# Patient Record
Sex: Male | Born: 1950 | Race: White | Hispanic: No | Marital: Married | Smoking: Never smoker
Health system: Southern US, Community
[De-identification: ages and names within clinical notes are randomized; demographics above are authoritative.]

## PROBLEM LIST (undated history)

## (undated) DIAGNOSIS — E119 Type 2 diabetes mellitus without complications: Secondary | ICD-10-CM

## (undated) HISTORY — PX: FOOT SURGERY: SHX648

## (undated) HISTORY — PX: PROSTATE SURGERY: SHX751

## (undated) HISTORY — PX: OTHER SURGICAL HISTORY: SHX169

---

## 2005-04-26 ENCOUNTER — Ambulatory Visit: Payer: Self-pay | Admitting: Family Medicine

## 2005-06-23 ENCOUNTER — Ambulatory Visit: Payer: Self-pay | Admitting: Family Medicine

## 2005-07-08 ENCOUNTER — Ambulatory Visit: Payer: Self-pay | Admitting: Family Medicine

## 2009-08-20 ENCOUNTER — Ambulatory Visit: Payer: Self-pay | Admitting: Unknown Physician Specialty

## 2009-08-27 ENCOUNTER — Ambulatory Visit: Payer: Self-pay | Admitting: Unknown Physician Specialty

## 2011-06-04 ENCOUNTER — Ambulatory Visit: Payer: Self-pay | Admitting: Unknown Physician Specialty

## 2011-09-18 ENCOUNTER — Emergency Department: Payer: Self-pay | Admitting: Emergency Medicine

## 2011-09-26 ENCOUNTER — Inpatient Hospital Stay: Payer: Self-pay | Admitting: Internal Medicine

## 2011-09-27 ENCOUNTER — Ambulatory Visit: Payer: Self-pay | Admitting: Urology

## 2011-09-28 LAB — BASIC METABOLIC PANEL
Anion Gap: 8 (ref 7–16)
BUN: 15 mg/dL (ref 7–18)
Calcium, Total: 8.6 mg/dL (ref 8.5–10.1)
EGFR (Non-African Amer.): >60
Glucose: 107 mg/dL — ABNORMAL HIGH (ref 65–99)
Potassium: 4 mmol/L (ref 3.5–5.1)
Sodium: 143 mmol/L (ref 136–145)

## 2011-09-29 LAB — BASIC METABOLIC PANEL
Chloride: 101 mmol/L (ref 98–107)
Co2: 27 mmol/L (ref 21–32)
Creatinine: 0.99 mg/dL (ref 0.60–1.30)
EGFR (African American): >60
EGFR (Non-African Amer.): >60
Glucose: 142 mg/dL — ABNORMAL HIGH (ref 65–99)
Osmolality: 277 (ref 275–301)

## 2011-10-18 ENCOUNTER — Ambulatory Visit: Payer: Self-pay | Admitting: Urology

## 2013-04-07 ENCOUNTER — Emergency Department: Payer: Self-pay | Admitting: Emergency Medicine

## 2013-04-07 LAB — COMPREHENSIVE METABOLIC PANEL
BUN: 14 mg/dL (ref 7–18)
Bilirubin,Total: 0.8 mg/dL (ref 0.2–1.0)
Calcium, Total: 8.6 mg/dL (ref 8.5–10.1)
Co2: 24 mmol/L (ref 21–32)
Creatinine: 1.1 mg/dL (ref 0.60–1.30)
EGFR (Non-African Amer.): >60
Glucose: 230 mg/dL — ABNORMAL HIGH (ref 65–99)
Osmolality: 274 (ref 275–301)
SGPT (ALT): 22 U/L (ref 12–78)
Sodium: 133 mmol/L — ABNORMAL LOW (ref 136–145)
Total Protein: 7.8 g/dL (ref 6.4–8.2)

## 2013-04-07 LAB — URINALYSIS, COMPLETE
Glucose,UR: >=500 mg/dL (ref 0–75)
Squamous Epithelial: <1
WBC UR: 15 /HPF (ref 0–5)

## 2013-04-07 LAB — CBC
HCT: 37.5 % — ABNORMAL LOW (ref 40.0–52.0)
HGB: 12.9 g/dL — ABNORMAL LOW (ref 13.0–18.0)
MCV: 91 fL (ref 80–100)
RBC: 4.13 10*6/uL — ABNORMAL LOW (ref 4.40–5.90)
RDW: 13.3 % (ref 11.5–14.5)
WBC: 12.8 10*3/uL — ABNORMAL HIGH (ref 3.8–10.6)

## 2013-04-07 LAB — LIPASE, BLOOD: Lipase: 167 U/L (ref 73–393)

## 2013-04-08 LAB — URINE CULTURE

## 2013-09-26 ENCOUNTER — Ambulatory Visit: Payer: Self-pay | Admitting: Family Medicine

## 2014-02-15 ENCOUNTER — Ambulatory Visit: Payer: Self-pay | Admitting: Unknown Physician Specialty

## 2015-01-19 NOTE — Consult Note (Signed)
PATIENT NAME:  Thomas Oconnell, Thomas Oconnell MR#:  [id number] DATE OF BIRTH:  02/12/1951  DATE OF CONSULTATION:  09/27/2011  REFERRING PHYSICIAN:  Katha Hamming, MD  CONSULTING PHYSICIAN:  Marin Olp, MD PRIMARY CARE PHYSICIAN: Dorothey Baseman, MD    REASON FOR CONSULTATION:  1. Obstructive uropathy with acute renal insufficiency.  2. Gross hematuria.  3. PSA elevation.  4. Urinary tract infection.   HISTORY OF PRESENT ILLNESS: The patient is a 64 year old white male with a history of insulin-dependent type 2 diabetes who was admitted via the Emergency Room on 09/26/2011 with acute renal insufficiency, hyperglycemia. The patient was in his usual state of health until 09/18/2011 when he developed the acute onset of fevers with a temperature to greater than 102 degrees Fahrenheit and shaking chills with an inability to void. The patient presented to the [facility] Emergency Department and was found to have an elevated white blood cell count at 16.8 thousand, a urinalysis consistent with a urinary tract infection. Blood sugar was 220 with a creatinine of 0.94. Blood cultures and urine cultures were sent. A noncontrasted CT of the abdomen and pelvis revealed the question of stones within the right posterior aspect of the bladder but was otherwise within normal limits without evidence for hydronephrosis or renal or ureterolithiasis. The patient had a catheterized urine which was sent for urinalysis. There is no documentation in the medical record as to the volume of urine obtained. The patient does not recall, either, how much urine was obtained with catheterization. The patient denies any discomfort with the catheterization and also reports that the digital rectal examination of the prostate performed in the Emergency Department was not painful or uncomfortable. The patient was discharged from the Emergency Department on Flomax and ciprofloxacin (10-day course). The patient also reports that he  had been experiencing lower abdominal pain and right lower back pain, throbbing in nature, reaching a 6 or 7 out of 10. The patient reports voiding a couple of times in the Emergency Department without difficulty prior to discharge with complete resolution of his suprapubic and right lower back pain. The urine culture later grew out greater than 100,000 colonies of a pansensitive Escherichia coli. The blood cultures were negative. The patient did well initially but approximately 48 hours later developed extreme frequency and urgency with urge incontinence and incontinence without awareness, particularly at night, requiring frequent clothing changes. The patient did not experience any dysuria or gross hematuria or recurrent fevers or chills. The patient presented to his primary care doctor's office on December 27th and was seen by a physician's assistant who prescribed Vicodin for pain as needed and scheduled the patient for Urologic consultation with Dr. Anola Gurney on Thursday, January 3rd. The patient continued, however, with frequency and urgency and extreme urinary incontinence and developed recurrent suprapubic and right lower back pain that would be somewhat relieved after voiding but was rapidly recurrent. This prompted the patient's representation to the [facility] Emergency Department on 09/26/2011.  Evaluation in the Emergency Department revealed acute renal insufficiency with a creatinine of 3.58, hyperglycemia with a blood sugar of 516, PSA elevation with a level of 7.9, and a urinalysis that was significant for a glucosuria but was otherwise negative without hematuria or pyuria or bacteriuria. A stone CT of the abdomen and pelvis without intravenous or p.o. contrast revealed mild bilateral hydronephrosis with severe bladder distention. No renal or ureterolithiasis or stones in the bladder were appreciated. A Foley catheter was placed with  the prompt drainage of 2000 mL per the  patient's report. The patient reported prompt relief of the suprapubic discomfort. With regard to the right lower back pain, the patient reports marked relief with the passage of flatus during an attempted bowel movement this morning. Urology is being consulted for further evaluation of the patient's acute renal failure with urinary retention as well as the development of gross hematuria after placement of the Foley catheter and PSA elevation.   GENITOURINARY HISTORY: The patient denies any prior history of lower urinary tract symptoms or urinary incontinence He would experience some mild increase in urinary frequency when his serum blood sugars were elevated but would quickly resolve with improvement in his blood sugars. The patient denies any personal or family history of urolithiasis or gross hematuria. The patient denies any family history of prostate cancer.   PAST MEDICAL HISTORY:   Adult onset diabetes mellitus present for 10 years and requiring insulin for the past year.  Hypothyroidism for the past five years.  Question of hypertension. The patient reports being placed on ramipril 10 years ago with the onset of his diabetes and was told that this was for renal protection and not necessarily for hypertension.   PAST SURGICAL HISTORY: The patient is status post hammertoe repair approximately seven years ago.   MEDICATIONS ON ADMISSION:  1. Tamsulosin 0.4 mg p.o. daily after the same meal.  2. Amaryl 4 mg p.o. daily.  3. Metformin 1000 mg p.o. at bedtime (this was held upon admission).  4. Ramipril 10 mg p.o. daily.  5. Levemir insulin 60 units subcutaneous in the a.m. and 45 units subcutaneous in the p.m.  6. Synthroid 0.1 mg p.o. daily.  7. Ciprofloxacin 500 mg p.o. b.i.d.  8. Aspirin 81 mg p.o. daily.  9. Multivitamin 1 p.o. daily.  10. B complex vitamin 1 p.o. daily.   ALLERGIES: No known drug allergies.   SOCIAL HISTORY: The patient is married. The patient denies any present or  past tobacco use. The patient denies any alcohol use or history of alcohol dependence.   FAMILY HISTORY: Significant for diabetes and hypertension but negative for urolithiasis or prostate cancer.   REVIEW OF SYSTEMS: GENERAL: As per the history of present illness. HEENT:  The patient reports chronic occasional sinusitis with early morning mild headaches that resolve after a hot shower. The patient also reports some recent epistaxis with drying of the nasal mucosa. RESPIRATORY: The patient denies any shortness of breath or chronic cough. CARDIOVASCULAR: The patient denies any chest pain. GI: The patient denies any nausea or vomiting but does report some loose stools over the past week that resolved with milk of magnesia. The patient denies any blood per rectum. GENITOURINARY: As per the history of present illness. HEMATOLOGIC: The patient denies any bleeding diathesis.  MUSCULOSKELETAL: The patient denies any muscle or joint pain. NEUROLOGIC: The patient denies any focal or lateralizing weakness. PSYCHIATRIC: The patient denies any depression or anxiety or suicidal ideation. SKIN: The patient denies any rashes or lesions.   PHYSICAL EXAMINATION:  VITAL SIGNS: Temperature 98.1 degrees Fahrenheit, pulse 84 and regular, respirations 18 and unlabored, blood pressure 128/75, pulse oximetry 93% on room air.   GENERAL: A well developed, well nourished white male in no apparent distress.   HEENT: Normocephalic and atraumatic with anicteric sclerae. Extraocular movements are intact.   NECK: No masses or bruits.   CHEST: Clear to auscultation with normal respiratory effort.  CARDIOVASCULAR: Regular rate and rhythm without murmurs, gallops, or rubs.  2+ radial pulses bilaterally, 1+ carotid pulses bilaterally.   ABDOMEN: Nontender, nondistended abdomen with normal active bowel sounds with no palpable abdominal masses or organomegaly. No inguinal hernias appreciated. Mild right costovertebral angle tenderness  present.   GENITOURINARY/RECTAL: Normal uncircumcised phallus with Foley catheter in place with mild blood-tinging of the urine in the tubing. Bilaterally descended testes without masses and nontender. Mild tenderness of the right epididymis without edema or induration. Digital rectal examination of the prostate revealed a normal sphincter tone with a 1+ enlarged prostate that was firm bilaterally but without induration, nodularity, or tenderness. The rectal vault was empty and without masses.   EXTREMITIES: Examination revealed trace pitting edema to the knees bilaterally.   SKIN: Skin did not reveal any rashes or lesions about the head, face, neck or upper extremities.   NEUROLOGIC: Nonfocal.   PSYCHIATRIC: Alert and oriented x4.   LABORATORY, DIAGNOSTIC AND RADIOLOGICAL DATA:  Sodium of 142, potassium 4.3, chloride 106, CO2 27, BUN 28, creatinine 1.61, glucose 134, calcium 8.5.  White blood cell count 8.2 thousand, hematocrit 33.9, platelet count 241.  Lower extremity Doppler ultrasound did not reveal any deep venous thrombosis.   ASSESSMENT:  1. Obstructive uropathy with acute renal insufficiency due to urinary retention: I agree with Foley catheterization. Would expect a 1 mg percent improvement in the creatinine per 24 hours of Foley decompression. Indeed, the creatinine has already improved from 3.58 to 1.61 from yesterday to today. The patient's most recent baseline was normal at 0.94 on 09/18/2011. The urine output was 2575 mL as of 7:00 a.m. this morning. Certainly, the patient is at risk for a persistent postobstructive diuresis, but currently he is not demonstrating this clinically and remains hemodynamically stable.   2. Gross hematuria: This developed after a Foley catheter decompression of a markedly distended bladder with 2000 mL obtained upon Foley catheterization in the Emergency Department and is not unexpected. The hematuria appears markedly improved this afternoon. There is no  indication for further evaluation at the moment.  3. PSA elevation: The PSA was elevated at 7.9 in the Emergency Department yesterday and could certainly be due to a number of different factors, including a urinary tract infection and recent Foley catheterization. There is no indication for further evaluation at the moment, but this should definitely be followed up as an outpatient to ensure normalization after resolution of the urinary tract infection and urinary retention.  4. Urinary tract infection: The patient has a culture-confirmed Escherichia coli urinary tract infection from a catheterized sample performed in the Emergency Department on 09/18/2011 that was treated appropriately with ciprofloxacin. The patient denies any prostatic tenderness on examination back on the 22nd. On examination today, he does have some tenderness of the right epididymis consistent with a mild right epididymitis.   RECOMMENDATIONS:  1. Continue Foley catheter decompression of the bladder until maximal improvement in renal function is achieved. The Foley catheter should be left in place for a minimum of one week given the extent of the bladder distention. The patient has an appointment already scheduled with Dr. Anola Gurney this Thursday, January 3rd, and should keep his appointment if he is discharged prior to Thursday.  2. Continue ciprofloxacin with dosing appropriate for the patient's renal function, anticipate being discharged on 500 mg p.o. b.i.d.  3. Continue tamsulosin 0.4 mg p.o. daily after the same meal.   Thank you for the opportunity to participate in the care of this most pleasant gentleman. We will follow while the patient is in  the hospital.   ____________________________ Marin Olp, MD [id number] D: 09/27/2011 13:05:51 ET T: 09/27/2011 14:34:57 ET JOB#: [id number]  cc: Marin Olp, MD, <Dictator> Marin Olp MD ELECTRONICALLY SIGNED 11/12/2011 15:55

## 2015-01-19 NOTE — Consult Note (Signed)
PATIENT NAME:  Thomas Oconnell, Thomas Oconnell MR#:  [id number] DATE OF BIRTH:  11-29-1950  DATE OF CONSULTATION:  09/27/2011  REFERRING PHYSICIAN:  Fulton Reek, MD CONSULTING PHYSICIAN:  Elven Laboy Lilian Kapur, MD  REASON FOR CONSULTATION: Acute renal failure, urinary retention, bilateral mild hydronephrosis.   HISTORY OF PRESENT ILLNESS: The patient is a very pleasant 64 year old Caucasian male with past medical history of benign prostatic hypertrophy, diabetes mellitus type 2, hypothyroidism, hypertension, and left foot surgery who presented to [facility] with complaints of lower extremity swelling originally. He has had a few recent visits to [facility] recently. He was here relatively recently for urinary retention secondary to benign prostatic hypertrophy as well as urinary tract infection. He was prescribed Flomax for treatment as well as antibiotic therapy. An appointment was made with Dr. Yves Dill for outpatient urology evaluation. However, he subsequently developed lower extremity swelling. He was subsequently seen in an acute care clinic locally. He was found to have acute renal failure and was sent for admission. He was also found to have hyperglycemia. The patient had a CT scan of the abdomen and pelvis which demonstrated mild bilateral hydronephrosis. The bladder was severely distended. He reports to me that he has had some troubles with urination relatively recently. It is difficult to maintain a good stream. He also has periods of urinary incontinence. He also has to strain at times to adequately urinate. The patient's creatinine upon admission was found to be 3.58. However, with Foley catheter drainage creatinine is now down to 1.6. He had 2 liters of urine output immediately upon Foley catheter placement. Over the past 24 hours he had 2.5 liters of urine output. Urology has also been consulted this hospitalization.   PAST MEDICAL HISTORY:  1. Diabetes mellitus, type  2.  2. Hypertension.  3. History of left foot surgery.  4. Hypothyroidism.  5. Benign prostatic hypertrophy.   ALLERGIES: No known drug allergies.    HOME MEDICATIONS:  1. Amaryl 4 mg p.o. b.i.d.  2. Metformin 1000 mg p.o. q. p.m.  3. Ramipril 10 mg daily.  4. Levemir 60 units subcutaneous every a.m. and 45 units subcutaneous q. p.m.  5. Synthroid 0.1 mg p.o. daily.  6. Flomax 0.4 mg p.o. daily.   SOCIAL HISTORY: The patient lives close to [place] [address]. He is married. He has six children. He denies tobacco, alcohol, or illicit drug use.   FAMILY HISTORY: Significant for chronic obstructive pulmonary disease, diabetes mellitus, and hypertension.  REVIEW OF SYSTEMS: CONSTITUTIONAL: Patient denies fevers, chills, or weight loss. EYES: Denies diplopia or blurry vision. HENT: Denies headaches, hearing loss, tinnitus, epistaxis, or sore throat. PULMONARY: Denies cough, shortness of breath, or hemoptysis. CARDIOVASCULAR: Denies chest pain, palpitations, PND, or orthopnea. GI: Denies nausea, vomiting, diarrhea, or bloody stools. GU: Reports significant urinary incontinence, dysuria at times, poor urine stream. NEUROLOGIC: Denies focal extremity numbness, weakness, or tingling. HEMATOLOGIC/LYMPHATIC: Denies easy bruisability, bleeding, or swollen lymph nodes. ENDOCRINE: Denies polyuria, polydipsia, or polyphagia. MUSCULOSKELETAL: Denies joint pain, swelling, or redness. INTEGUMENT: Denies skin rashes or lesions.  PSYCHIATRIC: Denies depression or bipolar disorder. ALLERGY/IMMUNOLOGIC: Denies seasonal allergies or history of immunodeficiency.   PHYSICAL EXAMINATION:  VITAL SIGNS: Temperature 98.1, pulse 84, respirations 18, blood pressure 128/75, pulse oximetry 93%.  GENERAL:  Well-developed, well-nourished Caucasian male who appears his stated age, currently in no acute distress.   HEENT: Normocephalic, atraumatic. Extraocular movements intact. Pupils equal, round, and reactive  to light. No scleral icterus. Conjunctivae  are pink. No epistaxis noted. Gross hearing intact. Oral mucosa moist. The patient has dentures in place.   NECK: Supple. No JVD or lymphadenopathy.   LUNGS: Clear to auscultation bilaterally with normal respiratory effort.   HEART: S1, S2. Regular rate and rhythm. No murmurs, rubs, or gallops appreciated.   ABDOMEN: Soft, nontender, nondistended. Bowel sounds positive. No rebound or guarding. No gross organomegaly appreciated.   EXTREMITIES: 2+ bilateral lower extremity edema noted. No cyanosis or clubbing.   SKIN: Warm and dry. No rashes noted.   NEUROLOGIC: The patient is alert and oriented to time, person, and place. Strength is five out of five in both upper and lower extremities.   SKIN: Warm and dry. No rashes noted.   PSYCHIATRIC: The patient has an appropriate affect and appears to have good insight into his current illness.   GU: Foley catheter in place. No suprapubic tenderness is noted at this point in time.   LABS/STUDIES: Sodium 142, potassium 4.3, chloride 106, CO2 27, BUN 28, creatinine 1.6, glucose 134, eGFR 47, TSH 10. CBC shows WBC 8.2, hemoglobin 11.3, hematocrit 33, platelets 241. Urine culture from 09/18/2011 was positive for Escherichia coli. Urinalysis on this admission showed urine glucose greater than 500 mg/dL, negative for protein, negative for nitrites, and negative for leukocyte esterase. No significant hematuria. A CT scan of the abdomen and pelvis demonstrates mild bilateral hydronephrosis.   IMPRESSION: This is a 64 year old Caucasian male with past medical history of diabetes mellitus type 2, hypothyroidism, benign prostatic hypertrophy, and history of left foot surgery who presented to [facility] with lower extremity edema and found to have acute renal failure.  1. Acute renal failure. 2. Mild bilateral hydronephrosis.  3. Benign prostatic hypertrophy.  4. Lower extremity edema.    PLAN:  The patient has presented now with acute renal failure. His acute renal failure is as a result of obstructive uropathy. It appears that the obstruction has been treated with Foley catheter placement and he has excellent urine output at this time. Creatinine has come down to 1.6. His baseline creatinine appears to be 0.9 on 09/18/2011. Therefore, we expect that he will have renal recovery over the next several days. We will need to watch for postobstructive diuresis. The patient has free access to water at this point in time. Urology has been consulted for underlying obstructive uropathy secondary to benign prostatic hypertrophy. He is appropriately maintained on Flomax at this point in time. No underlying sign of urinary tract infection at this time. In regards to his lower extremity edema, this may have been due to the acute urinary retention and inability to clear salt and water. Hopefully this should resolve. Lower extremity duplex was negative for deep vein thrombosis. We will continue to monitor renal function over the next several days with you.  I would like to thank Dr. Doy Hutching for this kind referral. Further plan as the patient progresses.   ____________________________ Tama High, MD [id number] D: 09/27/2011 12:14:55 ET T: 09/27/2011 13:17:11 ET JOB#: [id number]  cc: Tama High, MD, <Dictator> Mariah Milling Margarita Bobrowski MD ELECTRONICALLY SIGNED 10/19/2011 22:12

## 2015-01-19 NOTE — Op Note (Signed)
PATIENT NAME:  Thomas Oconnell, Thomas Oconnell MR#:  [id number] DATE OF BIRTH:  May 21, 1951  DATE OF PROCEDURE:  10/18/2011  PREOPERATIVE DIAGNOSES:  1. Urinary retention.  2. Benign prostatic hypertrophy.   POSTOPERATIVE DIAGNOSES:  1. Urinary retention.  2. Benign prostatic hypertrophy.   PROCEDURE PERFORMED: Photovaporization of the prostate with the green light laser.   SURGEON: Anola GurneyMichael Wolff, MD  ANESTHETIST: Randall AnGjibertus Van Staveren, MD  ANESTHESIA: General.   INDICATIONS: See the dictated history and physical. After informed consent, the patient requested the above procedure.   OPERATIVE SUMMARY: After adequate general anesthesia had been obtained, the patient was placed into the dorsal lithotomy position and the perineum was prepped and draped in the usual fashion. The laser scope was coupled with the camera and then visually advanced into the bladder. The bladder was moderately trabeculated. The patient had trilobar benign prostatic hypertrophy with visual obstruction. Estimated prostate gland size with 100 grams. At this point, the XPS green light laser fiber was introduced through the scope and vaporization was begun at the bladder neck, at a setting of 80 watts. Median lobe and lateral bladder neck tissue was then vaporized. The power was increased to 120 watts and the midportion of the prostate tissue was vaporized to the level of the verumontanum. Power was then increased to 150 watts and remaining obstructive tissue was vaporized. At this point, the scope was removed and a 20 [place] silicone catheter was placed. The catheter was irrigated until clear. A B and O      suppository was placed. The procedure was then terminated, and the patient was transferred to the recovery room in stable condition.  ____________________________ Suszanne ConnersMichael R. Evelene CroonWolff, MD [email or web address] D: 10/18/2011 09:16:52 ET T: 10/18/2011 11:46:12 ET JOB#: [id number]  cc: Suszanne ConnersMichael R. Evelene CroonWolff, MD, <Dictator> Teena Iraniavid M. Terance HartBronstein, MD Orson ApeMICHAEL  R WOLFF MD ELECTRONICALLY SIGNED 10/18/2011 13:09

## 2015-01-19 NOTE — Consult Note (Signed)
Chief Complaint:   Subjective/Chief Complaint Pt without new complaints. Denies excessive thirst. No recurrent gross hematuria.   VITAL SIGNS/ANCILLARY NOTES: **Vital Signs.:   01-Jan-13 05:13   Vital Signs Type Routine   Temperature Temperature (F) 97.6   Celsius 36.4   Temperature Source oral   Pulse Pulse 80   Pulse source per Dinamap   Respirations Respirations 18   Systolic BP Systolic BP 416   Diastolic BP (mmHg) Diastolic BP (mmHg) 81   Mean BP 96   BP Source Dinamap   Pulse Ox % Pulse Ox % 95   Pulse Ox Activity Level  At rest   Oxygen Delivery Room Air/ 21 %    07:38   Vital Signs Type POCT   Nurse Fingerstick (mg/dL) FSBS (fasting range 65-99 mg/dL) 138   Comments/Interventions  Nurse Notified  *Intake and Output.:   Shift 01-Jan-13 07:00   Grand Totals Intake:   Output:  2150    Net:  -2150 24 Hr.:  -2830   Urine ml     Out:  2150   Length of Stay Totals Intake:  720 Output:  3845    Net:  -5405    Daily 07:00   Grand Totals Intake:  720 Output:  3646    Net:  -8032 12 Hr.:  -2830   Oral Intake      In:  720   Urine ml     Out:  3550   Length of Stay Totals Intake:  720 Output:  2482    Net:  -5405   Brief Assessment:   Additional Physical Exam WDWN WM in NAD LE Edema resolved. Foley in good position with resolution of gross hematuria.   Routine Chem:  01-Jan-13 05:45    Glucose, Serum 107   BUN 15   Creatinine (comp) 1.04   Sodium, Serum 143   Potassium, Serum 4.0   Chloride, Serum 106   CO2, Serum 29   Calcium (Total), Serum 8.6   Anion Gap 8   Osmolality (calc) 286   eGFR (African American) >60   eGFR (Non-African American) >60   Assessment/Plan:  Invasive Device Daily Assessment of Necessity:   Does the patient currently have any of the following indwelling devices? foley    Indwelling Urinary Catheter continued, requirement due to    Reason to continue Indwelling Urinary Catheter for strict Intake/Output monitoring for  hemodynamic instability  inability to void as documented by bladder scanning   Assessment/Plan:   Assessment 1. 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 Gross Hematuria due to decompression of marked bladder distention (2L PVR)      -resolved  3. PSA Elevation  4. UTI      -continues afebrile    Plan No new GU recommendations:  1. Cipro 50080mo bid ordered. 2. Continue foley catheter decompression until maximal improvement in renal function achieved or at least 7 days. 3. Continue close monitoring of the pt's volume and hemodynamic status; maintain free access to water for prn intake. 4. Continue tamsulosin. 5. F/U PSA elevation, voiding efficiency as an outpt (Pt has an appt with Dr. MicMaryan Pulsursday, 09/29/2010; Dr. WolYves Dill also on call tomorrow and will  assume inpt consultation care, as well.)   Electronic Signatures: Darcella Cheshire (MD)  (Signed 01-Jan-13 10:02)  Authored: Chief Complaint, VITAL SIGNS/ANCILLARY NOTES, Brief Assessment, Lab Results, Assessment/Plan   Last Updated: 01-Jan-13 10:02 by Darcella Cheshire (MD)

## 2015-01-19 NOTE — Discharge Summary (Signed)
PATIENT NAME:  Thomas Oconnell, [name] MR#:  [id number] DATE OF BIRTH:  Feb 04, 1951  DATE OF ADMISSION:  09/26/2011 DATE OF DISCHARGE:  09/29/2011  DISPOSITION: Home.   DISCHARGE DIAGNOSES:  1. Acute renal failure, postobstructive uropathy resolved.  2. Urinary retention. Keep the Foley for one week.  3. Diabetes mellitus type 2. 4. Hypothyroidism.  5. Hypertension.  6. Urinary tract infection.   DISCHARGE MEDICATIONS:  1. Synthroid 150 mcg p.o. daily.  2. Amaryl 4 mg p.o. 1 tablet in the morning and 1 tablet at night.  3. Levemir 60 units in the day, 45 units in the evening. 4. Flomax 0.4 mg daily. 5. Cipro 500 mg p.o. b.i.d. for 10 days. 6. Hold the following medications: Metformin and lisinopril until seen by MD.   DISCHARGE INSTRUCTIONS: The patient needs to go home with Foley and Foley to leg bag.   DIET: Low sodium, ADA diet.   FOLLOW-UP:  1. Dr. Terance HartBronstein in 1 to 2 weeks. 2. Dr. Evelene CroonWolff in one week.   CONSULTATIONS:  1. Urology consultation with Dr. Selena BattenKim. 2. Nephrology with Dr. Mady HaagensenMunsoor Lateef.   HOSPITAL COURSE:  1. This is a 64 year old male patient with history of diabetes, hypertension, hypothyroidism, who came in because of lower extremity swelling and also urinary hesitancy. The patient was found to have acute renal failure and Foley placed in the ER and obtained about two liters of urine. The patient was admitted to the hospitalist service for acute renal failure, postobstructive uropathy. The patient has been into the ER a week ago and was found to have prostate infection and was discharged with Cipro and Flomax and he went home and made an appointment with Dr. Evelene CroonWolff, but because of worsening swelling and urinary urgency he came to the ER. When he came in, his creatinine was 3.58 and the patient's BUN and creatinine gradually improved. Today, it is 0.99. He also was started on IV fluids. The patient was seen by Dr. Selena BattenKim, urologist. His CT of the abdomen showed mild bilateral  hydronephrosis and urologist recommended to keep the Foley for one week and to decompress the bladder. The patient does have good urine output, about four liters yesterday, and spoke with Dr. Evelene CroonWolff who is on call. Recommended that we can discharge him as renal functions have been normal with excellent urine output. He can follow him in the office in one week. The patient explained that he can go with Foley and also given education about emptying the bladder and he can continue Flomax and Cipro and see Dr. Evelene CroonWolff.  2. Hyperglycemia. The patient's blood sugars were elevated to 516 when he came in. The patient's chest x-ray showed no acute disease and the patient was getting insulin. We stopped his metformin because of acute renal failure. He is getting Levemir 60 units b.i.d. and his sugars have been within normal range, around 170.   He can continue the same dose.  3. High blood pressure. His Lisinopril was stopped because of acute renal failure. The patient is on metoprolol. He can continue that. His blood pressure has been around 126/80, pulse is 80. The same is explained to the patient.  4. Escherichia coli urinary tract infection that was present on 12/22. While he has Foley, he is on Cipro 500 mg p.o. b.i.d.  5. Lower extremity edema, likely because of urinary retention. Swelling has decreased and lower extremity ultrasound negative for deep vein thrombosis. Echocardiogram showed ejection fraction of more than 55%.   CONDITION ON DISCHARGE:  The patient's condition is stable. Today, BUN 10, creatinine 0.99. Other labs are within normal limits.   TOTAL TIME SPENT ON DISCHARGE PREPARATION: More than 30 minutes.       FOLLOW-UP: The patient is to follow up with primary doctor and Dr. Evelene Croon. Discussed the plan with the patient's wife and answered all the questions.  ____________________________ Katha Hamming, MD sk:ap D: 09/29/2011 11:07:01 ET T: 09/30/2011 13:24:43  ET JOB#: [id number]  cc: Katha Hamming, MD, <Dictator> Teena Irani. Terance Hart, MD Katha Hamming MD ELECTRONICALLY SIGNED 10/03/2011 21:50

## 2015-06-07 IMAGING — CT CT STONE STUDY
3 of 4 series · 5 of 16 positions shown, 6 images · non-contrast
Comparison: September 26, 2011

CLINICAL DATA: Right flank pain, microscopic hematuria.

EXAM:
CT ABDOMEN AND PELVIS WITHOUT CONTRAST
TECHNIQUE: Multidetector CT imaging of the abdomen and pelvis was performed
following the standard protocol without IV contrast.

[Series 4: lung · axial · 0.84mm/px · z∈[-476,-476]mm · 1 of 27 slices shown, 2 images]
[im 1/27  soft-tissue]
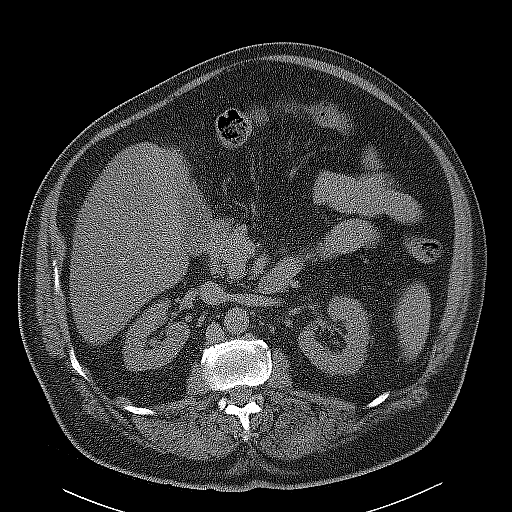
[im 1/27  bone]
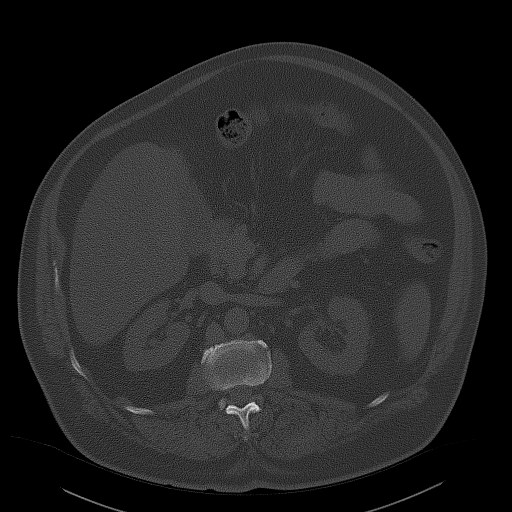

[Series 5: coronal · coronal · 0.91mm/px · 3 of 199 slices shown]
[im 40/199  soft-tissue]
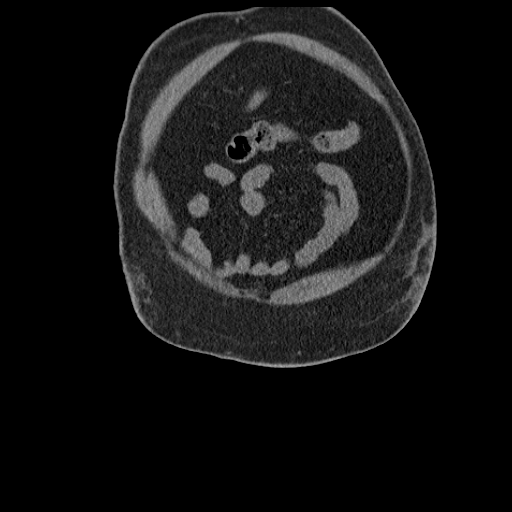
[im 80/199  soft-tissue]
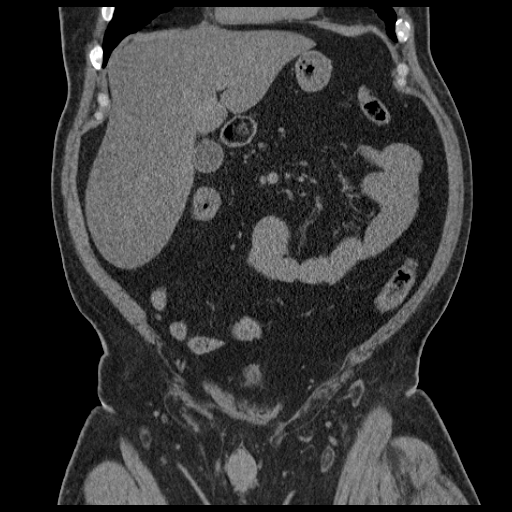
[im 119/199  soft-tissue]
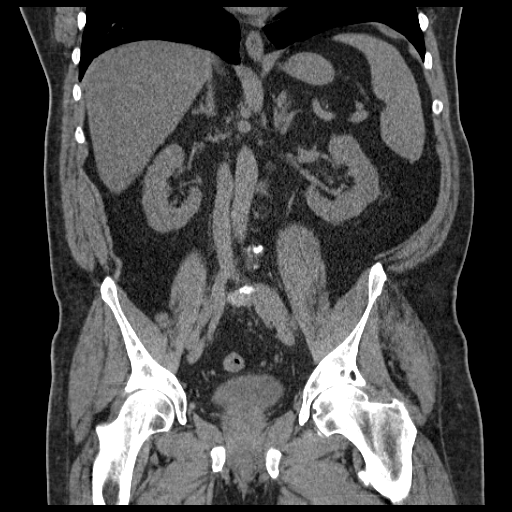

[Series 6: sagittal · sagittal · 0.91mm/px · 1 of 206 slices shown]
[im 83/206  soft-tissue]
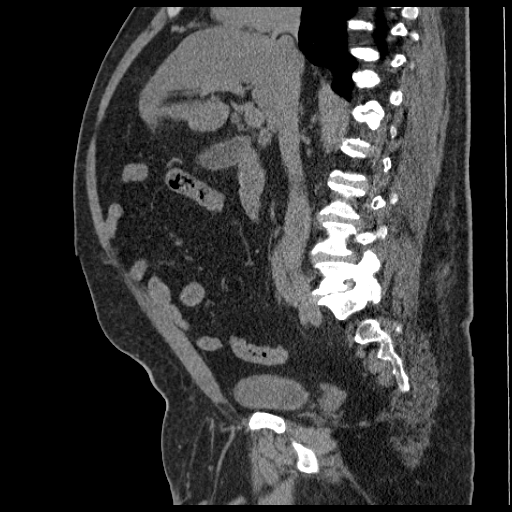

[5 of 16 positions shown; findings below may reference images not displayed]

FINDINGS: There is no nephrolithiasis or hydroureteronephrosis bilaterally.
The kidneys are normal on this noncontrast exam. There is diffuse
fatty infiltration of liver. The spleen, pancreas, gallbladder and
adrenal glands are normal. There is minimal atherosclerosis of the
abdominal aorta without aneurysmal dilatation. There is no abdominal
lymphadenopathy. There is no small bowel obstruction or
diverticulitis. The appendix is normal.

Fluid-filled bladder is normal. Prostate calcifications are
identified. There are small bilateral inguinal herniation of
mesenteric fat. There is left anterior lower abdominal/upper pelvis
subcutaneous fat stranding and skin thickening, chronic. The
visualized lung bases are clear. Degenerative joint changes of the
spine are noted.
IMPRESSION: No acute abnormality identified in the abdomen and pelvis. No
nephrolithiasis or hydroureteronephrosis bilaterally. Fatty
infiltration of liver.

## 2016-09-25 ENCOUNTER — Emergency Department
Admission: EM | Admit: 2016-09-25 | Discharge: 2016-09-25 | Disposition: A | Discharge status: Home / Self Care | Payer: Managed Care, Other (non HMO) | Attending: Emergency Medicine | Admitting: Emergency Medicine

## 2016-09-25 DIAGNOSIS — X500XXA Overexertion from strenuous movement or load, initial encounter: Secondary | ICD-10-CM | POA: Insufficient documentation

## 2016-09-25 DIAGNOSIS — Y999 Unspecified external cause status: Secondary | ICD-10-CM | POA: Diagnosis not present

## 2016-09-25 DIAGNOSIS — Y939 Activity, unspecified: Secondary | ICD-10-CM | POA: Diagnosis not present

## 2016-09-25 DIAGNOSIS — S39011A Strain of muscle, fascia and tendon of abdomen, initial encounter: Secondary | ICD-10-CM | POA: Insufficient documentation

## 2016-09-25 DIAGNOSIS — Y929 Unspecified place or not applicable: Secondary | ICD-10-CM | POA: Diagnosis not present

## 2016-09-25 DIAGNOSIS — E119 Type 2 diabetes mellitus without complications: Secondary | ICD-10-CM | POA: Insufficient documentation

## 2016-09-25 DIAGNOSIS — S3991XA Unspecified injury of abdomen, initial encounter: Secondary | ICD-10-CM | POA: Diagnosis present

## 2016-09-25 HISTORY — DX: Type 2 diabetes mellitus without complications: E11.9

## 2016-09-25 MED ORDER — TIZANIDINE HCL 4 MG PO TABS: 4.0000 mg | ORAL_TABLET | Freq: Three times a day (TID) | ORAL | 0 refills | Status: AC

## 2016-09-25 MED ORDER — ORPHENADRINE CITRATE 30 MG/ML IJ SOLN
60.0000 mg | Freq: Once | INTRAMUSCULAR | Status: AC
  Administered 2016-09-25: 60 mg via INTRAMUSCULAR
  Filled 2016-09-25: qty 2

## 2016-09-25 MED ORDER — OXYCODONE HCL 5 MG PO TABS: 5.0000 mg | ORAL_TABLET | Freq: Three times a day (TID) | ORAL | 0 refills | Status: AC | PRN

## 2016-09-25 MED ORDER — OXYCODONE HCL 5 MG PO TABS
5.0000 mg | ORAL_TABLET | Freq: Once | ORAL | Status: AC
  Administered 2016-09-25: 5 mg via ORAL
  Filled 2016-09-25: qty 1

## 2016-09-25 NOTE — Discharge Instructions (Signed)
Follow-up With orthopedics for symptoms that are not improving over the week. Return to the emergency room for symptoms that change or worsen if you are unable to schedule an appointment.

## 2016-09-25 NOTE — ED Notes (Signed)
Pt alert and oriented X4, active, cooperative, pt in NAD. RR even and unlabored, color WNL.  Pt informed to return if any life threatening symptoms occur.   

## 2016-09-25 NOTE — ED Notes (Signed)
Pt given ice pack per request. 

## 2016-09-25 NOTE — ED Provider Notes (Signed)
[facility] Emergency Department Provider Note ____________________________________________  Time seen: Approximately 4:38 PM  I have reviewed the triage vital signs and the nursing notes.   HISTORY  Chief Complaint Spasms    HPI Frederich ChaJames W Legan is a 65 y.o. male who presents to the emergency department for evaluation of right lateral chest wall/abdominal pain/hip pain. After lifting a car battery today, he developed severe muscle spasms and shooting pain. He has taken some tumuric and applied ice but otherwise has not taken any medications. He denies ever having had pain similar to this in the past.  Past Medical History:  Diagnosis Date  . Diabetes mellitus without complication (HCC)     There are no active problems to display for this patient.   Past Surgical History:  Procedure Laterality Date  . FOOT SURGERY     left  . left shoulder surgery    . PROSTATE SURGERY      Prior to Admission medications   Medication Sig Start Date End Date Taking? Authorizing Provider  oxyCODONE (ROXICODONE) 5 MG immediate release tablet Take 1 tablet (5 mg total) by mouth every 8 (eight) hours as needed. 09/25/16 09/25/17  Chinita Pesterari B Jimmie Dattilio, FNP  tiZANidine (ZANAFLEX) 4 MG tablet Take 1 tablet (4 mg total) by mouth 3 (three) times daily. 09/25/16   Chinita Pesterari B Rosia Syme, FNP    Allergies Patient has no known allergies.  No family history on file.  Social History Social History  Substance Use Topics  . Smoking status: Never Smoker  . Smokeless tobacco: Never Used  . Alcohol use No    Review of Systems Constitutional: No recent illness. Cardiovascular: Denies chest pain or palpitations. Respiratory: Denies shortness of breath. Musculoskeletal: Pain in Right lateral chest wall/abdomen/hip Skin: Negative for rash, wound, lesion. Neurological: Negative for focal weakness or numbness.  ____________________________________________   PHYSICAL EXAM:  VITAL  SIGNS: ED Triage Vitals  Enc Vitals Group     BP 09/25/16 1513 119/70     Pulse Rate 09/25/16 1513 70     Resp 09/25/16 1513 16     Temp 09/25/16 1513 98.2 F (36.8 C)     Temp src --      SpO2 09/25/16 1513 97 %     Weight 09/25/16 1514 215 lb (97.5 kg)     Height 09/25/16 1514 5\' 8"  (1.727 m)     Head Circumference --      Peak Flow --      Pain Score --      Pain Loc --      Pain Edu? --      Excl. in GC? --     Constitutional: Alert and oriented. Well appearing and in no acute distress. Eyes: Conjunctivae are normal. EOMI. Head: Atraumatic. Neck: No stridor.  Respiratory: Normal respiratory effort.   Musculoskeletal: Tender to palpation over the lateral rectus abdominis muscles. No bony or focal tenderness of the hip. Observed weight-bearing. Neurologic:  Normal speech and language. No gross focal neurologic deficits are appreciated. Speech is normal. No gait instability. Skin:  Skin is warm, dry and intact. Atraumatic. Psychiatric: Mood and affect are normal. Speech and behavior are normal.  ____________________________________________   LABS (all labs ordered are listed, but only abnormal results are displayed)  Labs Reviewed - No data to display ____________________________________________  RADIOLOGY  Not indicated ____________________________________________   PROCEDURES  Procedure(s) performed: None   ____________________________________________   INITIAL IMPRESSION / ASSESSMENT AND PLAN / ED COURSE  Clinical  Course     Pertinent labs & imaging results that were available during my care of the patient were reviewed by me and considered in my medical decision making (see chart for details).  After IM Norflex and oxycodone by mouth, patient reported significant reduction of pain. He'll be discharged home with Zanaflex and oxycodone. He was encouraged to follow-up with orthopedics for symptoms that are not improving over the next few days. He was  advised to ice the area 20 minutes per hour for the next couple of days then switch to heat. He was encouraged to return to the emergency department for symptoms that change or worsen if he is unable schedule an appointment. ____________________________________________   FINAL CLINICAL IMPRESSION(S) / ED DIAGNOSES  Final diagnoses:  Strain of rectus abdominis muscle, initial encounter       Chinita PesterCari B Simcha Farrington, FNP 09/25/16 2245    Jennye MoccasinBrian S Quigley, MD 09/25/16 2340

## 2016-09-25 NOTE — ED Triage Notes (Signed)
PT reports today around 12pm, he bent over to pick something up. Ever since, pt has been feeling spasms in right side area. Reports painful with and without movement. Denies any fall or injury to area. VS stable.
